# Patient Record
Sex: Male | Born: 1995 | Hispanic: Yes | Marital: Single | Smoking: Former smoker
Health system: Southern US, Community
[De-identification: ages and names within clinical notes are randomized; demographics above are authoritative.]

## PROBLEM LIST (undated history)

## (undated) DIAGNOSIS — K5000 Crohn's disease of small intestine without complications: Secondary | ICD-10-CM

## (undated) DIAGNOSIS — K5090 Crohn's disease, unspecified, without complications: Secondary | ICD-10-CM

## (undated) HISTORY — DX: Crohn's disease, unspecified, without complications: K50.90

## (undated) HISTORY — DX: Crohn's disease of small intestine without complications: K50.00

---

## 2018-08-24 ENCOUNTER — Ambulatory Visit (INDEPENDENT_AMBULATORY_CARE_PROVIDER_SITE_OTHER): Payer: Self-pay | Admitting: Internal Medicine

## 2018-08-30 ENCOUNTER — Ambulatory Visit (INDEPENDENT_AMBULATORY_CARE_PROVIDER_SITE_OTHER): Payer: PRIVATE HEALTH INSURANCE | Admitting: Internal Medicine

## 2018-08-30 ENCOUNTER — Encounter (INDEPENDENT_AMBULATORY_CARE_PROVIDER_SITE_OTHER): Payer: Self-pay | Admitting: Internal Medicine

## 2018-08-30 ENCOUNTER — Other Ambulatory Visit: Payer: Self-pay

## 2018-08-30 DIAGNOSIS — K5000 Crohn's disease of small intestine without complications: Secondary | ICD-10-CM | POA: Diagnosis not present

## 2018-08-30 DIAGNOSIS — R1010 Upper abdominal pain, unspecified: Secondary | ICD-10-CM | POA: Diagnosis not present

## 2018-08-30 DIAGNOSIS — K5090 Crohn's disease, unspecified, without complications: Secondary | ICD-10-CM

## 2018-08-30 HISTORY — DX: Crohn's disease, unspecified, without complications: K50.90

## 2018-08-30 NOTE — Patient Instructions (Signed)
Labs and CT abdomen/pelvis with CM.

## 2018-08-30 NOTE — Progress Notes (Signed)
   Subjective:    Patient ID: Michael Hammond, male    DOB: 11/16/1975, 23 y.o.   MRN: [id number]  HPI  Referred by Dr. Olena Leatherwood for Crohn's disease. States he was diagnosed with possible Crohn's disease last year by CT scan. He was having abdominal pain. Had been having abdominal pain for about a week or so. There had been no fever. He said he did have weight loss. His stools were either loose or formed. Underwent a CT which revealed: 08/06/2017 CT abdomen/pelvis with CM: wt loss. Mid to lower abdominal pain. Suspect mild inflammatory process involving the terminal ileum possible Crohn's disease. No other significant abdominal/pelvic findings.  He was started on Sulfasalazine 500mg  TID. Also covered last year with Prednisone. His appetite is okay. Maybe a one pound weight loss.  He is not having any abdominal pain. If he has epigastric pain, he has nausea.  Has epigastric pain about 1-3 times a week.  Has a BM daily. No melena or BRRB.  No family hx of Crohn's disease.  Works at [employer] in [place].    Review of Systems     Past Medical History:  Diagnosis Date  . Crohn disease (HCC) 08/30/2018      No Known Allergies  Current Outpatient Medications on File Prior to Visit  Medication Sig Dispense Refill  . sulfaSALAzine (AZULFIDINE) 500 MG tablet Take 500 mg by mouth 3 (three) times daily.     No current facility-administered medications on file prior to visit.      Objective:   Physical Exam Blood pressure 127/85, pulse 90, temperature 98 F (36.7 C), height 5\' 9"  (1.753 m), weight 110 lb 4.8 oz (50 kg).  Alert and oriented. Skin warm and dry. Oral mucosa is moist.   . Sclera anicteric, conjunctivae is pink. Thyroid not enlarged. No cervical lymphadenopathy. Lungs clear. Heart regular rate and rhythm.  Abdomen is soft. Bowel sounds are positive. No hepatomegaly. No abdominal masses felt. No tenderness.  No edema to lower extremities.         Assessment & Plan:  Crohn's  disease. Am going to get a CT abdomen/pelvis with CM. CRP and CBC. OV in 3 months.

## 2018-08-31 LAB — CBC WITH DIFFERENTIAL/PLATELET
Absolute Monocytes: 577 cells/uL (ref 200–950)
Basophils Absolute: 37 cells/uL (ref 0–200)
Basophils Relative: 0.5 %
Eosinophils Absolute: 110 cells/uL (ref 15–500)
Eosinophils Relative: 1.5 %
HCT: 42.4 % (ref 38.5–50.0)
Hemoglobin: 14.7 g/dL (ref 13.2–17.1)
Lymphs Abs: 1832 cells/uL (ref 850–3900)
MCH: 32.7 pg (ref 27.0–33.0)
MCHC: 34.7 g/dL (ref 32.0–36.0)
MCV: 94.2 fL (ref 80.0–100.0)
MPV: 11 fL (ref 7.5–12.5)
Monocytes Relative: 7.9 %
Neutro Abs: 4745 cells/uL (ref 1500–7800)
Neutrophils Relative %: 65 %
Platelets: 252 10*3/uL (ref 140–400)
RBC: 4.5 10*6/uL (ref 4.20–5.80)
RDW: 11.6 % (ref 11.0–15.0)
Total Lymphocyte: 25.1 %
WBC: 7.3 10*3/uL (ref 3.8–10.8)

## 2018-08-31 LAB — C-REACTIVE PROTEIN: CRP: 0.5 mg/L (ref <8.0)

## 2018-09-04 ENCOUNTER — Telehealth (INDEPENDENT_AMBULATORY_CARE_PROVIDER_SITE_OTHER): Payer: Self-pay | Admitting: Internal Medicine

## 2018-09-04 DIAGNOSIS — K5010 Crohn's disease of large intestine without complications: Secondary | ICD-10-CM

## 2018-09-04 NOTE — Telephone Encounter (Signed)
Am going to order an Inflammatory bowel disease panel

## 2018-09-14 LAB — INFLAMMATORY BOWEL DISEASE-IBD
Atypical pANCA: <1:20 {titer}
Saccharomyces cerevisiae, IgA: <20 Units (ref 0.0–24.9)
Saccharomyces cerevisiae, IgG: 23 Units (ref 0.0–24.9)

## 2018-09-24 ENCOUNTER — Ambulatory Visit (HOSPITAL_COMMUNITY)
Admission: RE | Admit: 2018-09-24 | Discharge: 2018-09-24 | Disposition: A | Discharge status: Home / Self Care | Payer: PRIVATE HEALTH INSURANCE | Source: Ambulatory Visit | Attending: Internal Medicine | Admitting: Internal Medicine

## 2018-09-24 ENCOUNTER — Other Ambulatory Visit: Payer: Self-pay

## 2018-09-24 DIAGNOSIS — R1010 Upper abdominal pain, unspecified: Secondary | ICD-10-CM | POA: Diagnosis present

## 2018-09-24 DIAGNOSIS — K5000 Crohn's disease of small intestine without complications: Secondary | ICD-10-CM | POA: Insufficient documentation

## 2018-09-24 MED ORDER — IOHEXOL 300 MG/ML  SOLN
75.0000 mL | Freq: Once | INTRAMUSCULAR | Status: AC | PRN
  Administered 2018-09-24: 13:00:00 75 mL via INTRAVENOUS

## 2018-09-25 ENCOUNTER — Telehealth (INDEPENDENT_AMBULATORY_CARE_PROVIDER_SITE_OTHER): Payer: Self-pay | Admitting: Internal Medicine

## 2018-09-25 NOTE — Telephone Encounter (Signed)
Michael Hammond, Needs OV in 3 months.

## 2018-12-26 ENCOUNTER — Ambulatory Visit (INDEPENDENT_AMBULATORY_CARE_PROVIDER_SITE_OTHER): Payer: PRIVATE HEALTH INSURANCE | Admitting: Internal Medicine

## 2018-12-26 ENCOUNTER — Encounter (INDEPENDENT_AMBULATORY_CARE_PROVIDER_SITE_OTHER): Payer: Self-pay | Admitting: Internal Medicine

## 2018-12-26 ENCOUNTER — Other Ambulatory Visit: Payer: Self-pay

## 2018-12-26 VITALS — BP 120/85 | HR 72 | Temp 98.2°F | Resp 18 | Ht 69.0 in | Wt 109.0 lb

## 2018-12-26 DIAGNOSIS — R634 Abnormal weight loss: Secondary | ICD-10-CM | POA: Diagnosis not present

## 2018-12-26 DIAGNOSIS — R1012 Left upper quadrant pain: Secondary | ICD-10-CM | POA: Diagnosis not present

## 2018-12-26 MED ORDER — OMEPRAZOLE 20 MG PO CPDR: 20.0000 mg | DELAYED_RELEASE_CAPSULE | Freq: Every day | ORAL | 1 refills | Status: AC

## 2018-12-26 NOTE — Progress Notes (Signed)
CBC, CRP 

## 2018-12-26 NOTE — Progress Notes (Addendum)
Subjective:    Patient ID: Michael Hammond, male    DOB: 03-03-1996, 23 y.o.   MRN: [id number]  HPI  Michael SportsmanBrandon Hammond is a 23 year old male who who presents today for follow up regarding suspected small bowel Crohn's disease. He reports having variable abdominal pain for the past 2 years. He was seen by his PCP March 2019 with complaints of generalized abdominal pain. He underwent an abd/pelvic CT 08/22/2017 which showed mild inflammation at the TI with mucosal wall thickening somewhat suspicious for small bowel Crohn's disease. His PCP placed him on Sulfasalzine 500mg  po tid. Possibly took Prednisone briefly.He stated his generalized abdominal pain was "numbed" after taking Sulfasalazine but never completely abated. He was having soft to loose brown stools 1 to 2 times daily without blood or mucous, no change after starting Sulfasalazine. His stress level was significantly elevated at that time due to family issues. He continued to have abdominal pain, mostly LUQ pain. He was seen in our office by Dorene Arerri  NP 08/30/2018. A repeat abdominal/pelvic CT was done 09/24/2018 which was normal, no inflammation at the TI. A CBC, CRP and IBD panel were negative. Sulfasalazine was discontinued.   He presents today with continued intermittent LUQ abdominal pain which occurs approximately 3 times weekly. He describes the pain as sharp, sometimes throbbing quality. No specific food triggers. He can eat a certain food and have LUQ pain on one day, if he eats the exact same food another day he does not experience any stomach pain. No nausea or vomiting. No classic heartburn. He sometimes feels food gets stuck briefly in his throat if he eats too fast, food passes after he drinks water. He takes Aleve 2 tabs infrequently, last took one day last week for neck pain. His alcohol intake varies, typically does not drink. However, every once in a while he will drink 3 to 4 tequila drinks on Fri and Saturday night. No drug use.  He eats frozen or prepared meals. Low fiber diet. He has difficulty gaining weight. He reports losing a few pounds, weighs 110 lbs. No fever, sweats or chills. History of anxiety and depression, previously took medications but he can't remember the names of these medications. He is less stressed now and he feels his anxiety and depression are well controlled. He does not see a therapist. No family history of stomach ulcer, upper or lower GI cancer.  Past medical history: abdominal pain  Current Outpatient Medications on File Prior to Visit  Medication Sig Dispense Refill  . sulfaSALAzine (AZULFIDINE) 500 MG tablet Take 500 mg by mouth 3 (three) times daily.     No current facility-administered medications on file prior to visit.    No Known Allergies  FH: no family hx of upper or lower GI malignancy SH: single male, drinks 3 to 4 drinks monthly or less, he vapes, no drug use  Review of Systems See HPI. All other systems reviewed and are negative     Objective:   Physical Exam BP 120/85   Pulse 72   Temp 98.2 F (36.8 C) (Oral)   Resp 18   Ht 5\' 9"  (1.753 m)   Wt 109 lb (49.4 kg)   BMI 16.10 kg/m   General: thin male in NAD Head: normocephalic Eyes: sclera non-icteric Mouth: dentition intact, no ulcers Neck: supple, no thyromegaly Heart: RRR, no murmur Lungs: clear throughout Abdomen: soft, nondistended, flat, nontender, + BS x 4 quads, no HSM. Extremities: no edema Neuro: alert and oriented,  no focal deficits     Assessment & Plan:   1. 23 y.o. male wit LUQ abdominal pain. I would NOT label him with the diagnosis of Crohn's disease  -check H.Pylori breath test, CBC, CRP, Celiac panel, CMP,  Lipase -eventual EGD if his LUQ pain persists -trail with Omeprazole 20mg  one tab po QD to start after H. Pylori breath test completed -gastritis handout given to patient, avoid alcohol, avoid NSAIDS -follow up in office in 6 weeks, patient to call office if his abdominal pain worsens  -stop folic acid as patient was prescribed this when he was on Sulfasalazine  2. Weight loss, unable to gain weight. CTAP x 2 does not explain weight loss -TSH

## 2018-12-26 NOTE — Patient Instructions (Addendum)
1. Complete the provided lab order, do not eat/drink or  vap for 1 hour prior to lab collection  2. Omeprazole 20mg  one capsule by mouth to be taken 30 minutes before breakfast. Do not start Omeprazole until you have completed the above lab order.  3. Avoid spicy acidic foods  4. Avoid alcohol   5. Call office if your abdominal pain worsens  6. Follow up in the office in 6 weeks Gastritis, Adult Gastritis is inflammation of the stomach. There are two kinds of gastritis:  Acute gastritis. This kind develops suddenly.  Chronic gastritis. This kind is much more common and lasts for a long time. Gastritis happens when the lining of the stomach becomes weak or gets damaged. Without treatment, gastritis can lead to stomach bleeding and ulcers. What are the causes? This condition may be caused by:  An infection.  Drinking too much alcohol.  Certain medicines. These include steroids, antibiotics, and some over-the-counter medicines, such as aspirin or ibuprofen.  Having too much acid in the stomach.  A disease of the intestines or stomach.  Stress.  An allergic reaction.  Crohn's disease.  Some cancer treatments (radiation). Sometimes the cause of this condition is not known. What are the signs or symptoms? Symptoms of this condition include:  Pain or a burning sensation in the upper abdomen.  Nausea.  Vomiting.  An uncomfortable feeling of fullness after eating.  Weight loss.  Bad breath.  Blood in your vomit or stools. In some cases, there are no symptoms. How is this diagnosed? This condition may be diagnosed with:  Your medical history and a description of your symptoms.  A physical exam.  Tests. These can include: ? Blood tests. ? Stool tests. ? A test in which a thin, flexible instrument with a light and a camera is passed down the esophagus and into the stomach (upper endoscopy). ? A test in which a sample of tissue is taken for testing (biopsy). How  is this treated? This condition may be treated with medicines. The medicines that are used vary depending on the cause of the gastritis:  If the condition is caused by a bacterial infection, you may be given antibiotic medicines.  If the condition is caused by too much acid in the stomach, you may be given medicines called H2 blockers, proton pump inhibitors, or antacids. Treatment may also involve stopping the use of certain medicines, such as aspirin, ibuprofen, or other NSAIDs. Follow these instructions at home: Medicines  Take over-the-counter and prescription medicines only as told by your health care provider.  If you were prescribed an antibiotic medicine, take it as told by your health care provider. Do not stop taking the antibiotic even if you start to feel better. Eating and drinking   Eat small, frequent meals instead of large meals.  Avoid foods and drinks that make your symptoms worse.  Drink enough fluid to keep your urine pale yellow. Alcohol use  Do not drink alcohol if: ? Your health care provider tells you not to drink. ? You are pregnant, may be pregnant, or are planning to become pregnant.  If you drink alcohol: ? Limit your use to:  0-1 drink a day for women.  0-2 drinks a day for men. ? Be aware of how much alcohol is in your drink. In the [place] one drink equals one 12 oz bottle of beer (355 mL), one 5 oz glass of wine (148 mL), or one 1 oz glass of hard liquor (44  mL). General instructions  Talk with your health care provider about ways to manage stress, such as getting regular exercise or practicing deep breathing, meditation, or yoga.  Do not use any products that contain nicotine or tobacco, such as cigarettes and e-cigarettes. If you need help quitting, ask your health care provider.  Keep all follow-up visits as told by your health care provider. This is important. Contact a health care provider if:  Your symptoms get worse.  Your symptoms  return after treatment. Get help right away if:  You vomit blood or material that looks like coffee grounds.  You have black or dark red stools.  You are unable to keep fluids down.  Your abdominal pain gets worse.  You have a fever.  You do not feel better after one week. Summary  Gastritis is inflammation of the lining of the stomach that can occur suddenly (acute) or develop slowly over time (chronic).  This condition is diagnosed with a medical history, a physical exam, or tests.  This condition may be treated with medicines to treat infection or medicines to reduce the amount of acid in your stomach.  Follow your health care provider's instructions about taking medicines, making changes to your diet, and knowing when to call for help. This information is not intended to replace advice given to you by your health care provider. Make sure you discuss any questions you have with your health care provider. Document Released: 05/10/2001 Document Revised: 10/03/2017 Document Reviewed: 10/03/2017 Elsevier Patient Education  2020 [employer].

## 2019-01-14 LAB — COMPREHENSIVE METABOLIC PANEL
AG Ratio: 2.2 (calc) (ref 1.0–2.5)
ALT: 16 U/L (ref 9–46)
AST: 17 U/L (ref 10–40)
Albumin: 4.8 g/dL (ref 3.6–5.1)
Alkaline phosphatase (APISO): 40 U/L (ref 36–130)
BUN: 9 mg/dL (ref 7–25)
CO2: 30 mmol/L (ref 20–32)
Calcium: 9.7 mg/dL (ref 8.6–10.3)
Chloride: 106 mmol/L (ref 98–110)
Creat: 0.82 mg/dL (ref 0.60–1.35)
Globulin: 2.2 g/dL (calc) (ref 1.9–3.7)
Glucose, Bld: 82 mg/dL (ref 65–99)
Potassium: 4.3 mmol/L (ref 3.5–5.3)
Sodium: 141 mmol/L (ref 135–146)
Total Bilirubin: 0.8 mg/dL (ref 0.2–1.2)
Total Protein: 7 g/dL (ref 6.1–8.1)

## 2019-01-14 LAB — CELIAC DISEASE PANEL
(tTG) Ab, IgA: 1 U/mL
(tTG) Ab, IgG: 4 U/mL
Gliadin IgA: 8 Units
Gliadin IgG: 2 Units
Immunoglobulin A: 134 mg/dL (ref 47–310)

## 2019-01-14 LAB — CBC
HCT: 41.7 % (ref 38.5–50.0)
Hemoglobin: 14.3 g/dL (ref 13.2–17.1)
MCH: 31.4 pg (ref 27.0–33.0)
MCHC: 34.3 g/dL (ref 32.0–36.0)
MCV: 91.6 fL (ref 80.0–100.0)
MPV: 10.8 fL (ref 7.5–12.5)
Platelets: 241 10*3/uL (ref 140–400)
RBC: 4.55 10*6/uL (ref 4.20–5.80)
RDW: 11.8 % (ref 11.0–15.0)
WBC: 4.9 10*3/uL (ref 3.8–10.8)

## 2019-01-14 LAB — LIPASE: Lipase: 35 U/L (ref 7–60)

## 2019-01-14 LAB — H. PYLORI BREATH TEST: H. pylori Breath Test: NOT DETECTED

## 2019-01-14 LAB — C-REACTIVE PROTEIN: CRP: <0.2 mg/L (ref <8.0)

## 2019-01-14 LAB — TSH: TSH: 0.65 mIU/L (ref 0.40–4.50)

## 2019-02-04 NOTE — Progress Notes (Addendum)
Subjective:    Patient ID: Michael Hammond, male    DOB: 03-Feb-1996, 23 y.o.   MRN: [id number]  It does not appear that patient has inflammatory bowel disease.  CT in March 2019 revealed wall thickening UTI but study in April 2020 was unremarkable. His present symptoms are not consistent with IBD. Agree with above recommendations.  Michael Hammond is a 23 year old male who I saw in the office on 12/26/2018 for follow up regarding LUQ abdominal pain. As previously reviewed, Michael Hammond was seen by his PCP March 2019 with complaints of generalized abdominal pain. He underwent an abd/pelvic CT 08/22/2017 which showed mild inflammation at the TI with mucosal wall thickening somewhat suspicious for small bowel Crohn's disease. His PCP placed him on Sulfasalzine 500mg  po tid. Possibly took Prednisone briefly. He stated his generalized abdominal pain was "numbed" after taking Sulfasalazine but never completely abated. He was having soft to loose brown stools 1 to 2 times daily without blood or mucous, no change after starting Sulfasalazine. His stress level was significantly elevated at that time due to family issues. He continued to have abdominal pain, mostly LUQ pain. He was seen in our office by Dorene Arerri Setzer NP 08/30/2018. A repeat abdominal/pelvic CT was done 09/24/2018 which was normal, no inflammation at the TI. A CBC, CRP and IBD panel were negative. Sulfasalazine was discontinued. I saw Michael Hammond in the office on 12/26/2018 for follow up with complaints of intermittent LUQ pain. Labs were ordered and I prescribed Omeprazole 20mg  once daily. 01/11/2019 H. Pylori breath test was negative. Celiac panel was normal. CBC was normal. TSH 0.65.  He presents today for follow up. He did not start taking Omeprazole because he didn't have any further LUQ pain. However, he awakened in the middle of the night with a headache 2 weeks ago. He took Aleve 2 tabs on an empty stomach and shortly after he vomited partially digested  food. He does not recall what he ate for dinner the evening before. No further vomiting since then. His BM pattern varies, he passes a soft or solid stool most days. Occasional loose stool. No rectal bleeding. No further weight loss, his weight remains at 109lbs. He often skips meals.  CBC    Component Value Date/Time   WBC 4.9 01/11/2019 1013   RBC 4.55 01/11/2019 1013   HGB 14.3 01/11/2019 1013   HCT 41.7 01/11/2019 1013   PLT 241 01/11/2019 1013   MCV 91.6 01/11/2019 1013   MCH 31.4 01/11/2019 1013   MCHC 34.3 01/11/2019 1013   RDW 11.8 01/11/2019 1013   LYMPHSABS 1,832 08/30/2018 1002   EOSABS 110 08/30/2018 1002   BASOSABS 37 08/30/2018 1002       Objective:   Physical Exam BP 123/78    Pulse 64    Temp 98.6 F (37 C) (Oral)    Wt 109 lb 4.8 oz (49.6 kg)    BMI 16.14 kg/m   General: 23 year old thin male in no acute distress Eyes: Sclera nonicteric, conjunctiva pink Heart: Regular rate and rhythm, no murmurs Lungs: Clear throughout Abdomen: Flat, soft, nontender, no masses or organomegaly Extremities: No edema Neuro: Alert and oriented x4, no focal deficits    Assessment & Plan:   1.  Left upper quadrant pain, improving.  No significant left upper quadrant abdominal pain since his last office visit.  H. pylori breath test and celiac panel were negative.  Weight is stable at 109 pounds but he still underweight. -Increase protein intake, recommended  eating 2 protein bars daily.  Do not skip meals. -I recommended scheduling an EGD if his left upper quadrant pain recurs or if he is unable to gain weight. -Follow-up in the office in 3 to 4 months

## 2019-02-06 ENCOUNTER — Ambulatory Visit (INDEPENDENT_AMBULATORY_CARE_PROVIDER_SITE_OTHER): Payer: PRIVATE HEALTH INSURANCE | Admitting: Nurse Practitioner

## 2019-02-06 ENCOUNTER — Other Ambulatory Visit: Payer: Self-pay

## 2019-02-06 ENCOUNTER — Encounter (INDEPENDENT_AMBULATORY_CARE_PROVIDER_SITE_OTHER): Payer: Self-pay | Admitting: Nurse Practitioner

## 2019-02-06 VITALS — BP 123/78 | HR 64 | Temp 98.6°F | Wt 109.3 lb

## 2019-02-06 DIAGNOSIS — R1012 Left upper quadrant pain: Secondary | ICD-10-CM

## 2019-02-06 NOTE — Patient Instructions (Signed)
1. Eat 3 meals daily, try eating 1 or 2 protein bars daily  2. Call our office if your left upper abdominal pain recurs   3. Follow up in office in 3 to 4 months and as needed

## 2019-06-10 ENCOUNTER — Ambulatory Visit (INDEPENDENT_AMBULATORY_CARE_PROVIDER_SITE_OTHER): Payer: PRIVATE HEALTH INSURANCE | Admitting: Nurse Practitioner

## 2019-06-11 ENCOUNTER — Ambulatory Visit (INDEPENDENT_AMBULATORY_CARE_PROVIDER_SITE_OTHER): Payer: PRIVATE HEALTH INSURANCE | Admitting: Internal Medicine

## 2020-10-26 IMAGING — CT CT ABDOMEN AND PELVIS WITH CONTRAST
2 of 4 series · 14 of 46 positions shown, 16 images · IV contrast (Isovue)
Comparison: 08/22/2017

CLINICAL DATA: Abdominal pain, Crohn's disease

EXAM:
CT ABDOMEN AND PELVIS WITH CONTRAST
TECHNIQUE: Multidetector CT imaging of the abdomen and pelvis was performed
using the standard protocol following bolus administration of
intravenous contrast.
CONTRAST:  75mL OMNIPAQUE IOHEXOL 300 MG/ML SOLN, additional oral
enteric contrast

[Series 2: axial st · axial · 0.59mm/px · z∈[-768,-348]mm · 11 of 94 slices shown, 13 images]
[im 5/94  soft-tissue]
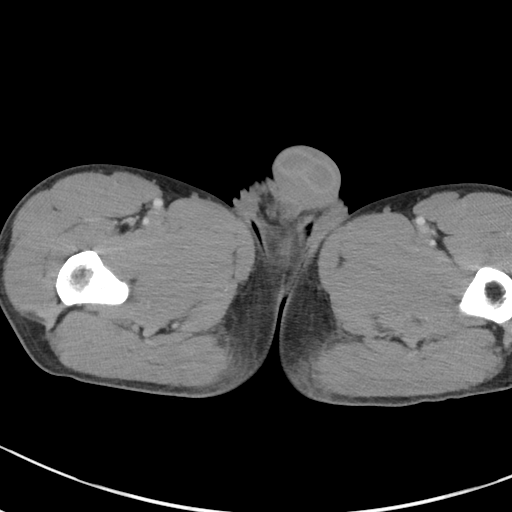
[im 5/94  bone]
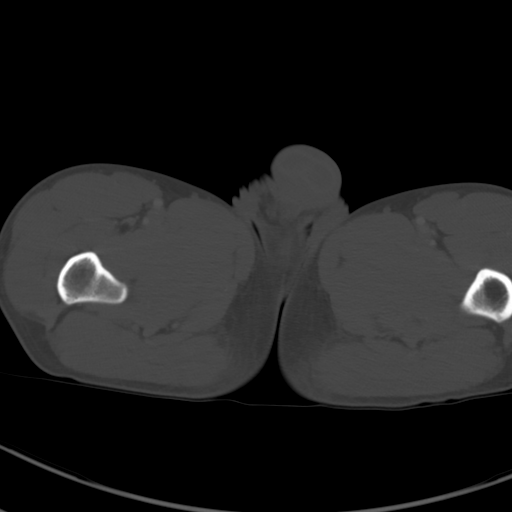
[im 15/94  soft-tissue]
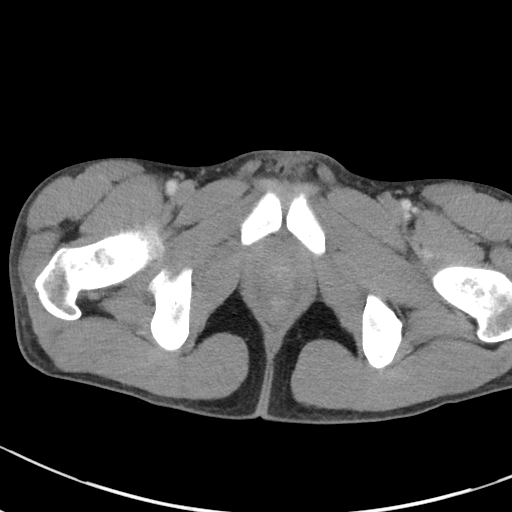
[im 25/94  soft-tissue]
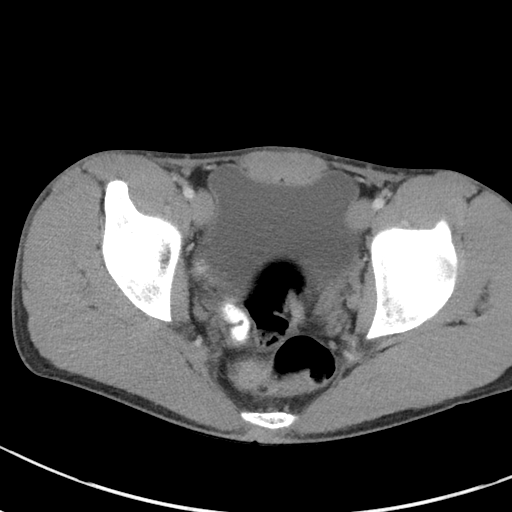
[im 30/94  soft-tissue]
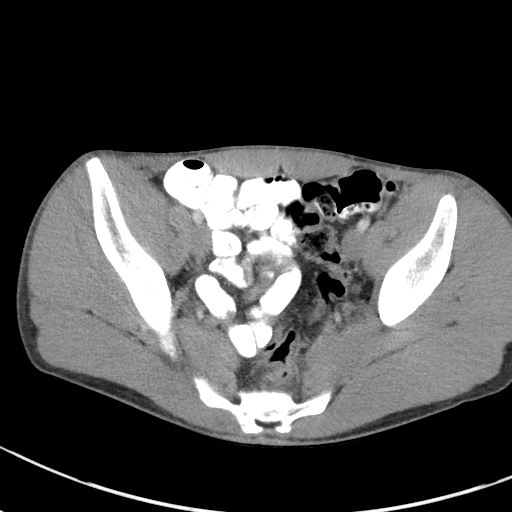
[im 40/94  soft-tissue]
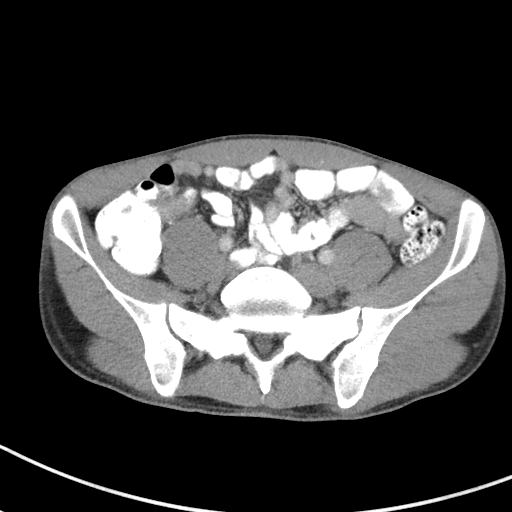
[im 49/94  soft-tissue]
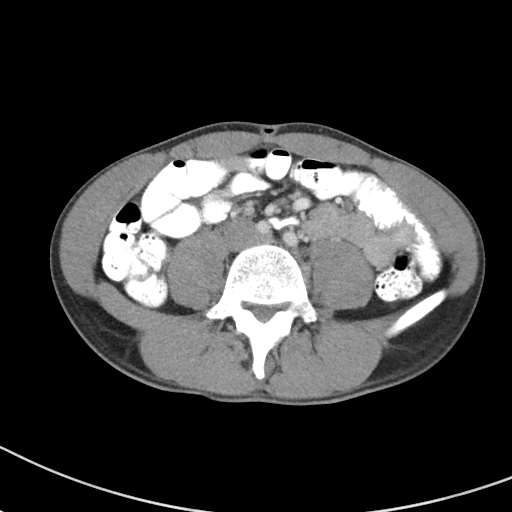
[im 54/94  soft-tissue]
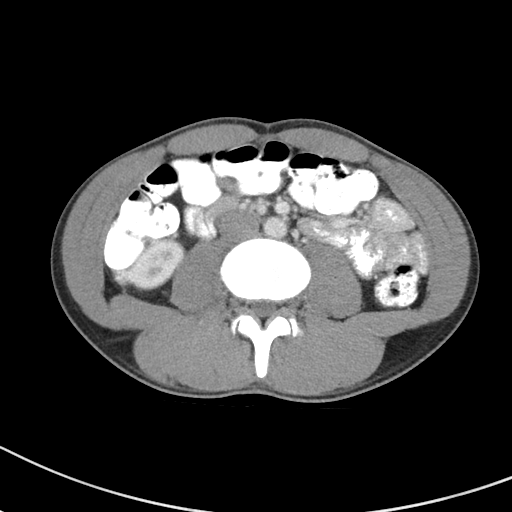
[im 64/94  soft-tissue]
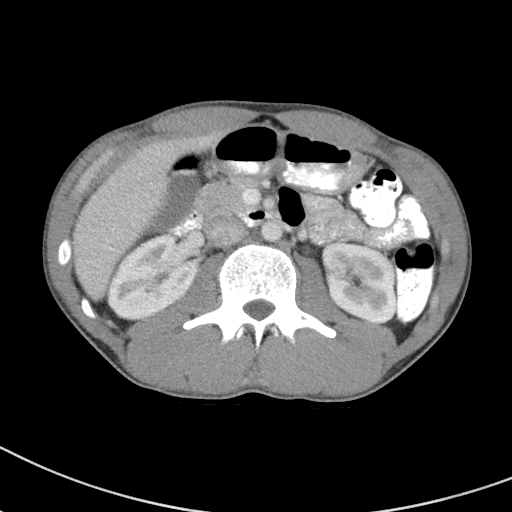
[im 69/94  soft-tissue]
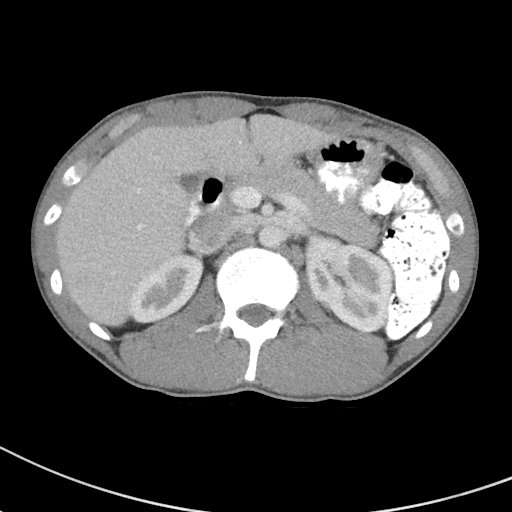
[im 69/94  bone]
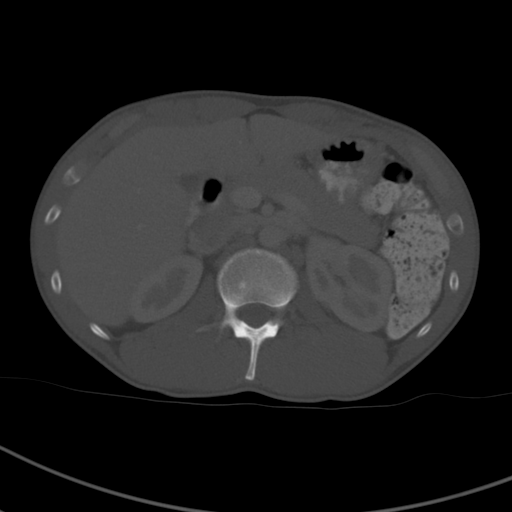
[im 79/94  soft-tissue]
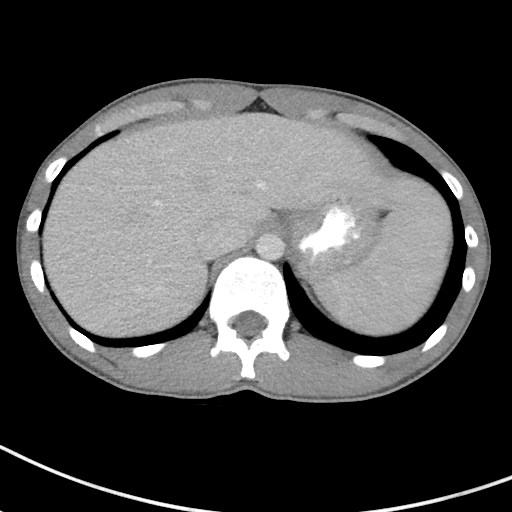
[im 89/94  soft-tissue]
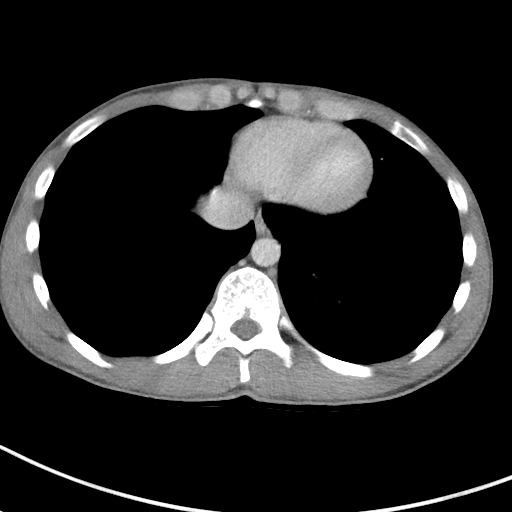

[Series 5: coronal st · coronal · 0.50mm/px · 3 of 66 slices shown]
[im 22/66  soft-tissue]
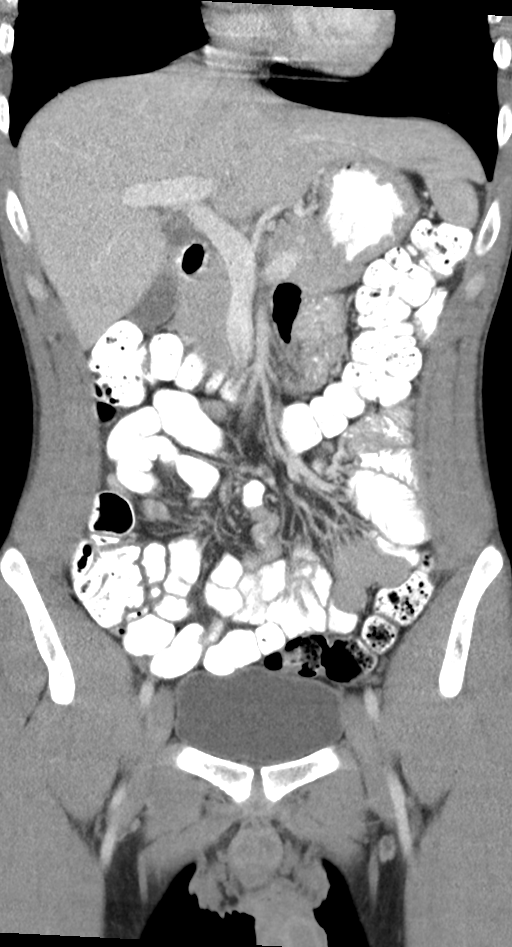
[im 29/66  soft-tissue]
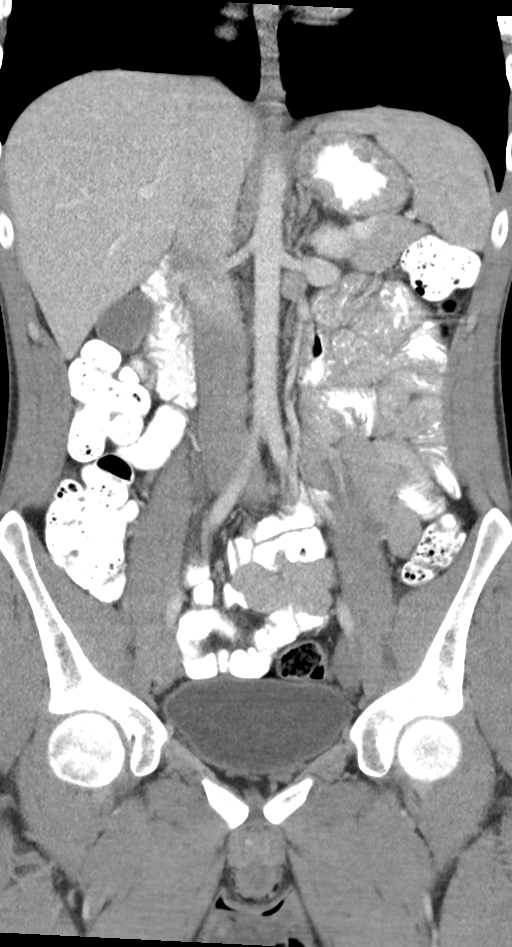
[im 37/66  soft-tissue]
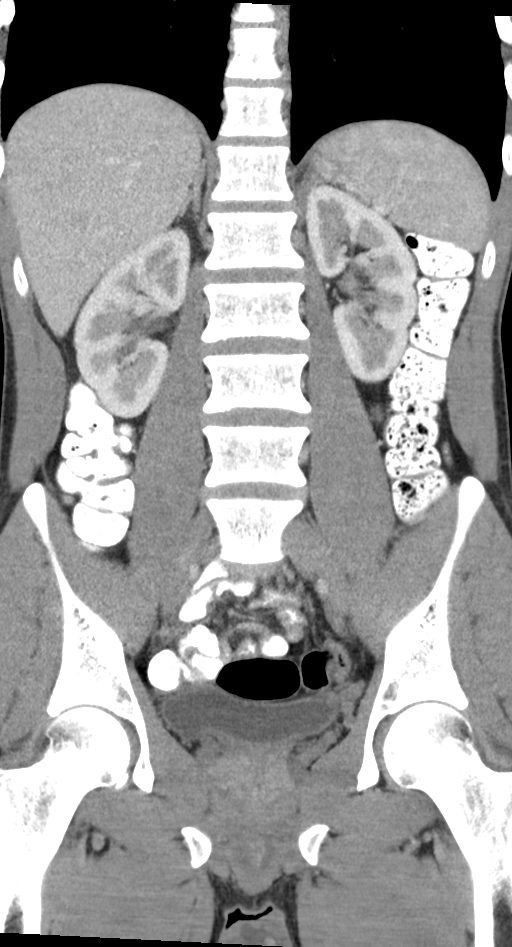

[14 of 46 positions shown; findings below may reference images not displayed]

FINDINGS: Lower chest: No acute abnormality.

Hepatobiliary: No focal liver abnormality is seen. No gallstones,
gallbladder wall thickening, or biliary dilatation.

Pancreas: Unremarkable. No pancreatic ductal dilatation or
surrounding inflammatory changes.

Spleen: Normal in size without focal abnormality.

Adrenals/Urinary Tract: Adrenal glands are unremarkable. Kidneys are
normal, without renal calculi, focal lesion, or hydronephrosis.
Bladder is unremarkable.

Stomach/Bowel: Stomach is within normal limits. Appendix appears
normal. No evidence of bowel wall thickening, distention, or
inflammatory changes.

Vascular/Lymphatic: No significant vascular findings are present. No
enlarged abdominal or pelvic lymph nodes.

Reproductive: No mass or other abnormality.

Other: No abdominal wall hernia or abnormality. No abdominopelvic
ascites.

Musculoskeletal: No acute or significant osseous findings.
IMPRESSION: No CT findings to explain abdominal pain. There are no inflammatory
findings to suggest active Crohn's disease. Further evaluation of
the bowel for evidence of inflammation, stricture, or fistula
complicating Crohn's disease may be performed by dedicated CT or MR
enterography protocol if desired.

## 2020-12-01 ENCOUNTER — Ambulatory Visit (HOSPITAL_COMMUNITY): Payer: No Typology Code available for payment source | Admitting: Clinical

## 2020-12-01 ENCOUNTER — Telehealth (HOSPITAL_COMMUNITY): Payer: Self-pay | Admitting: Clinical

## 2020-12-01 ENCOUNTER — Other Ambulatory Visit: Payer: Self-pay

## 2020-12-01 NOTE — Telephone Encounter (Signed)
The pt was a no show, however, did call back in and rescheduled for this Thursday.

## 2020-12-03 ENCOUNTER — Ambulatory Visit (INDEPENDENT_AMBULATORY_CARE_PROVIDER_SITE_OTHER): Payer: No Typology Code available for payment source | Admitting: Clinical

## 2020-12-03 ENCOUNTER — Other Ambulatory Visit: Payer: Self-pay

## 2020-12-03 DIAGNOSIS — F331 Major depressive disorder, recurrent, moderate: Secondary | ICD-10-CM | POA: Diagnosis not present

## 2020-12-03 DIAGNOSIS — F419 Anxiety disorder, unspecified: Secondary | ICD-10-CM
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
## 2020-12-17 ENCOUNTER — Ambulatory Visit (INDEPENDENT_AMBULATORY_CARE_PROVIDER_SITE_OTHER): Payer: No Typology Code available for payment source | Admitting: Clinical

## 2020-12-17 ENCOUNTER — Other Ambulatory Visit: Payer: Self-pay

## 2020-12-17 DIAGNOSIS — F331 Major depressive disorder, recurrent, moderate: Secondary | ICD-10-CM

## 2020-12-17 DIAGNOSIS — F419 Anxiety disorder, unspecified: Secondary | ICD-10-CM | POA: Diagnosis not present

## 2020-12-17 NOTE — Progress Notes (Addendum)
Virtual Visit via Video Note   I connected with Michael Hammond on 12/17/20 at 3:00 PM EDT by a video enabled telemedicine application and verified that I am speaking with the correct person using two identifiers.   Location: Patient: Home Provider: Office   I discussed the limitations of evaluation and management by telemedicine and the availability of in person appointments. The patient expressed understanding and agreed to proceed.     THERAPIST PROGRESS NOTE   Session Time: 3:00PM-3:30PM   Participation Level: Active   Behavioral Response: CasualAlertDepression/Anxiety   Type of Therapy: Individual Therapy   Treatment Goals addressed: Coping   Interventions: CBT, Solution Focused, Strength-based, Supportive and Social Skills Training   Summary: Michael Hammond is a 25 y.o. male who presents with Bi-Polar and ADHD. The OPT therapist worked with the patient for his ongoing treatment. The OPT therapist utilized Motivational Interviewing to assist in creating therapeutic repore. The patient in the session was engaged and work in collaboration giving feedback about his triggers and symptoms over the past few weeks . The patient spoke about his stress at work and in his relationship as being triggers. The OPT therapist utilized Cognitive Behavioral Therapy through cognitive restructuring as well as worked with the patient on coping strategies to assist in management of mood, improve coping, and improve positive thinking while empowering the patient.The OPT therapist worked with the patient on balancing his work/leisure. The OPT therapist examined upcoming health appointments and continued to encourage the patient to stay consistent and compliant with all medical appointments and recommendations.    Suicidal/Homicidal: Nowithout intent/plan   Therapist Response: The OPT therapist worked with the patient for the patients  scheduled session. The patient was engaged in his session and gave  feedback in relation to triggers, symptoms, and behavior responses over the past few weeks. The OPT therapist worked with the patient utilizing an in session Cognitive Behavioral Therapy exercise. The patient was responsive in the session and verbalized, " I have difficulty from my work stress with switching off from work when I am done back to my free time and not allowing my work stress to spill over". The OPT therapist talked with the patient reviewing coping skills to assist with managing mood and giving encouragement to continue to implement to assist with leveling his mood. The patient spoke about the impact of doing more than his job at work and this spilling over into his private life. The OPT therapist worked with the patient validating his feelings and encouraging the patient to improve his work / life balance  through implementing boundaries.The OPT therapist will continue treatment work with the patient in his next scheduled session.     Plan: Return again in 3 weeks.   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 The patient was provided an opportunity to ask questions and all were answered. The patient agreed with the plan and demonstrated an understanding of the instructions.   The patient was advised to call back or seek an in-person evaluation if the symptoms worsen or if the condition fails to improve as anticipated.   I provided 30 minutes of non-face-to-face time during this encounter.   Winfred Burn, LCSW   12/17/2020

## 2021-01-12 ENCOUNTER — Other Ambulatory Visit: Payer: Self-pay

## 2021-01-12 ENCOUNTER — Ambulatory Visit (INDEPENDENT_AMBULATORY_CARE_PROVIDER_SITE_OTHER): Payer: No Typology Code available for payment source | Admitting: Clinical

## 2021-01-12 DIAGNOSIS — F331 Major depressive disorder, recurrent, moderate: Secondary | ICD-10-CM | POA: Diagnosis not present

## 2021-01-12 DIAGNOSIS — F419 Anxiety disorder, unspecified: Secondary | ICD-10-CM

## 2021-01-12 NOTE — Progress Notes (Signed)
Virtual Visit via Video Note   I connected with Michael Hammond on 01/12/21 at 2:00 PM EDT by a video enabled telemedicine application and verified that I am speaking with the correct person using two identifiers.   Location: Patient: Home Provider: Office   I discussed the limitations of evaluation and management by telemedicine and the availability of in person appointments. The patient expressed understanding and agreed to proceed.     THERAPIST PROGRESS NOTE   Session Time: 2:00PM-2:30PM   Participation Level: Active   Behavioral Response: CasualAlertDepression/Anxiety   Type of Therapy: Individual Therapy   Treatment Goals addressed: Coping   Interventions: CBT, Solution Focused, Strength-based, Supportive and Social Skills Training   Summary: Michael Hammond is a 25 y.o. male who presents with Bi-Polar and ADHD. The OPT therapist worked with the patient for his ongoing treatment. The OPT therapist utilized Motivational Interviewing to assist in creating therapeutic repore. The patient in the session was engaged and work in collaboration giving feedback about his triggers and symptoms over the past few weeks . The patient spoke about currently being on vacation in [facility] [place] , [place], [place]. The OPT therapist utilized Cognitive Behavioral Therapy through cognitive restructuring as well as worked with the patient on coping strategies to assist in management of mood, improve coping, and improve positive thinking while empowering the patient.The OPT therapist worked with the patient on balancing his work/leisure.  Th patient did identify concerns within his relationship but elected to speak on these in his next session.The OPT therapist examined upcoming health appointments and continued to encourage the patient to stay consistent and compliant with all medical appointments and recommendations.    Suicidal/Homicidal: Nowithout intent/plan   Therapist Response: The OPT  therapist worked with the patient for the patients  scheduled session. The patient was engaged in his session and gave feedback in relation to triggers, symptoms, and behavior responses over the past few weeks. The OPT therapist worked with the patient utilizing an in session Cognitive Behavioral Therapy exercise. The patient was responsive in the session and verbalized, " I am on vacation now and just working to focus on enjoying my time off and not thinking about work while I am on my break". The OPT therapist talked with the patient reviewing coping skills to assist with managing mood and giving encouragement to continue to implement to assist with leveling his mood. The OPT therapist worked with the patient validating his feelings and encouraging the patient to improve his work / life balance  through implementing boundaries. The patient notes post vacation expecting to address concerns with his girlfriend and not being sure the direction or future of the relationship.The OPT therapist will continue treatment work with the patient in his next scheduled session.     Plan: Return again in 3 weeks.   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 The patient was provided an opportunity to ask questions and all were answered. The patient agreed with the plan and demonstrated an understanding of the instructions.   The patient was advised to call back or seek an in-person evaluation if the symptoms worsen or if the condition fails to improve as anticipated.   I provided 30 minutes of  non-face-to-face time during this encounter.   Winfred Burn, LCSW   01/12/2021

## 2021-02-04 ENCOUNTER — Ambulatory Visit (INDEPENDENT_AMBULATORY_CARE_PROVIDER_SITE_OTHER): Payer: No Typology Code available for payment source | Admitting: Clinical

## 2021-02-04 ENCOUNTER — Other Ambulatory Visit: Payer: Self-pay

## 2021-02-04 DIAGNOSIS — F419 Anxiety disorder, unspecified: Secondary | ICD-10-CM

## 2021-02-04 DIAGNOSIS — F331 Major depressive disorder, recurrent, moderate: Secondary | ICD-10-CM

## 2021-02-04 NOTE — Progress Notes (Signed)
Virtual Visit via Telephone Note  I connected with Michael Hammond on 02/04/21 at  2:00 PM EDT by telephone and verified that I am speaking with the correct person using two identifiers.  Location: Patient: Home Provider: Office   I discussed the limitations, risks, security and privacy concerns of performing an evaluation and management service by telephone and the availability of in person appointments. I also discussed with the patient that there may be a patient responsible charge related to this service. The patient expressed understanding and agreed to proceed.  THERAPIST PROGRESS NOTE   Session Time: 2:00PM-2:45PM   Participation Level: Active   Behavioral Response: CasualAlertDepression/Anxiety   Type of Therapy: Individual Therapy   Treatment Goals addressed: Coping   Interventions: CBT, Solution Focused, Strength-based, Supportive and Social Skills Training   Summary: Michael Hammond is a 25 y.o. male who presents with Bi-Polar and ADHD. The OPT therapist worked with the patient for his ongoing treatment. The OPT therapist utilized Motivational Interviewing to assist in creating therapeutic repore. The patient in the session was engaged and work in collaboration giving feedback about his triggers and symptoms over the past few weeks . The patient spoke about stress around finances and getting a speeding ticket and stress from transitioning from vacation going right back into work. The OPT therapist utilized Cognitive Behavioral Therapy through cognitive restructuring as well as worked with the patient on coping strategies to assist in management of mood, improve coping, and improve positive thinking while empowering the patient.The OPT therapist worked with the patient on balancing his work/leisure.  The patient identified ongoing concerns within his relationship that he reviewed in the session The OPT therapist worked with the patient in reviewing a healthy relationship.The OPT  therapist examined upcoming health appointments and continued to encourage the patient to stay consistent and compliant with all medical appointments and recommendations.    Suicidal/Homicidal: Nowithout intent/plan   Therapist Response: The OPT therapist worked with the patient for the patients  scheduled session. The patient was engaged in his session and gave feedback in relation to triggers, symptoms, and behavior responses over the past few weeks. The OPT therapist worked with the patient utilizing an in session Cognitive Behavioral Therapy exercise. The patient was responsive in the session and verbalized, " I think stress over finances and my relationship are the main things ". The OPT therapist talked with the patient reviewing coping skills to assist with managing mood and giving encouragement to continue to implement to assist with leveling his mood. The OPT therapist worked with the patient validating his feelings and encouraging the patient to improve his work / life balance  through implementing boundaries. The patient notes not being sure the direction or future of the relationship.The OPT therapist will continue treatment work with the patient in his next scheduled session.     Plan: Return again in 3 weeks.   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 The patient was provided an opportunity to ask questions and all were answered. The patient agreed with the plan and demonstrated an understanding of the instructions.   The patient was advised to call back or seek an in-person evaluation if the symptoms worsen or if the condition fails  to improve as anticipated.   I provided 45 minutes of non-face-to-face time during this encounter.   Winfred Burn, LCSW   02/04/2022

## 2021-02-25 ENCOUNTER — Ambulatory Visit (INDEPENDENT_AMBULATORY_CARE_PROVIDER_SITE_OTHER): Payer: No Typology Code available for payment source | Admitting: Clinical

## 2021-02-25 ENCOUNTER — Other Ambulatory Visit: Payer: Self-pay

## 2021-02-25 DIAGNOSIS — F331 Major depressive disorder, recurrent, moderate: Secondary | ICD-10-CM

## 2021-02-25 DIAGNOSIS — F419 Anxiety disorder, unspecified: Secondary | ICD-10-CM | POA: Diagnosis not present

## 2021-02-25 NOTE — Progress Notes (Signed)
Virtual Visit via Telephone Note   I connected with Michael Hammond on 02/25/21 at  3:00 PM EDT by telephone and verified that I am speaking with the correct person using two identifiers.   Location: Patient: Home Provider: Office   I discussed the limitations, risks, security and privacy concerns of performing an evaluation and management service by telephone and the availability of in person appointments. I also discussed with the patient that there may be a patient responsible charge related to this service. The patient expressed understanding and agreed to proceed.   THERAPIST PROGRESS NOTE   Session Time: 3:00PM-3:45PM   Participation Level: Active   Behavioral Response: CasualAlertDepression/Anxiety   Type of Therapy: Individual Therapy   Treatment Goals addressed: Coping   Interventions: CBT, Solution Focused, Strength-based, Supportive and Social Skills Training   Summary: Michael Hammond is a 25 y.o. male who presents with Bi-Polar and ADHD. The OPT therapist worked with the patient for his ongoing treatment. The OPT therapist utilized Motivational Interviewing to assist in creating therapeutic repore. The patient in the session was engaged and work in collaboration giving feedback about his triggers and symptoms over the past few weeks . The patient spoke about stress around his relationship with his girlfriend. The OPT therapist utilized Cognitive Behavioral Therapy through cognitive restructuring as well as worked with the patient on coping strategies to assist in management of mood, improve coping, and improve positive thinking while empowering the patient.The OPT therapist worked with the patient on balancing his work/leisure.  The patient identified changes within his relationship from him sitting down with his girlfriend an having a conversation with her about his expectations moving forward with the relationship. The OPT therapist worked with the patient in reviewing a  healthy relationship.The OPT therapist examined upcoming health appointments and continued to encourage the patient to stay consistent and compliant with all medical appointments and recommendations.    Suicidal/Homicidal: Nowithout intent/plan   Therapist Response: The OPT therapist worked with the patient for the patients  scheduled session. The patient was engaged in his session and gave feedback in relation to triggers, symptoms, and behavior responses over the past few weeks. The OPT therapist worked with the patient utilizing an in session Cognitive Behavioral Therapy exercise. The patient was responsive in the session and verbalized, " I had a honest conversation and she agreed to the boundaries  ". The OPT therapist talked with the patient reviewing coping skills to assist with managing mood and giving encouragement to continue to implement to assist with leveling his mood. The OPT therapist worked with the patient validating his feelings and encouraging the patient to continue to be mindful of work/life balance.The OPT therapist will continue treatment work with the patient in his next scheduled session.     Plan: Return again in 3 weeks.   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 The patient was provided an opportunity to ask questions and all were answered. The patient agreed with the plan and demonstrated an understanding of the instructions.   The patient was advised to call back or seek an in-person evaluation if the symptoms worsen or if the condition fails to improve as anticipated.   I provided 45 minutes of  non-face-to-face time during this encounter.   Winfred Burn, LCSW   02/25/2021

## 2021-03-18 ENCOUNTER — Ambulatory Visit (INDEPENDENT_AMBULATORY_CARE_PROVIDER_SITE_OTHER): Payer: No Typology Code available for payment source | Admitting: Clinical

## 2021-03-18 ENCOUNTER — Other Ambulatory Visit: Payer: Self-pay

## 2021-03-18 DIAGNOSIS — F419 Anxiety disorder, unspecified: Secondary | ICD-10-CM | POA: Diagnosis not present

## 2021-03-18 DIAGNOSIS — F331 Major depressive disorder, recurrent, moderate: Secondary | ICD-10-CM | POA: Diagnosis not present

## 2021-03-18 NOTE — Progress Notes (Signed)
Virtual Visit via Telephone Note   I connected with Michael Hammond on 03/18/2021 at  2:00 PM EDT by telephone and verified that I am speaking with the correct person using two identifiers.   Location: Patient: Home Provider: Office   I discussed the limitations, risks, security and privacy concerns of performing an evaluation and management service by telephone and the availability of in person appointments. I also discussed with the patient that there may be a patient responsible charge related to this service. The patient expressed understanding and agreed to proceed.   THERAPIST PROGRESS NOTE   Session Time: 2:00PM-2:30PM   Participation Level: Active   Behavioral Response: CasualAlertDepression/Anxiety   Type of Therapy: Individual Therapy   Treatment Goals addressed: Coping   Interventions: CBT, Solution Focused, Strength-based, Supportive and Social Skills Training   Summary: Michael Hammond is a 25 y.o. male who presents with Bi-Polar and ADHD. The OPT therapist worked with the patient for his ongoing treatment. The OPT therapist utilized Motivational Interviewing to assist in creating therapeutic repore. The patient in the session was engaged and work in collaboration giving feedback about his triggers and symptoms over the past few weeks . The patient spoke about his relationship with his girlfriend and work the two have been doing to improve their communication. The patient spoke about a work related change that had a impact with the patient expecting more on his recent raise than he received. The OPT therapist utilized Cognitive Behavioral Therapy through cognitive restructuring as well as worked with the patient on coping strategies to assist in management of mood, improve coping, and improve positive thinking while empowering the patient.The OPT therapist worked with the patient on balancing his work/leisure. The OPT therapist worked with the patient in reviewing a healthy  relationship. The patient in this session identified his difficulty around general decision making as a trigger and worked with the OPT therapist on utilizing a decision making tool to assist.The OPT therapist examined upcoming health appointments and continued to encourage the patient to stay consistent and compliant with all medical appointments and recommendations.    Suicidal/Homicidal: Nowithout intent/plan   Therapist Response: The OPT therapist worked with the patient for the patients  scheduled session. The patient was engaged in his session and gave feedback in relation to triggers, symptoms, and behavior responses over the past few weeks. The OPT therapist worked with the patient utilizing an in session Cognitive Behavioral Therapy exercise. The patient was responsive in the session and verbalized, " I got screwed with my work I was expecting more from my raise, but I think I handled it well and it didn't effect me as much as I thought It would I wasn't as angry as I expected I was going to be". The OPT therapist talked with the patient reviewing coping skills to assist with managing mood and giving encouragement to continue to implement to assist with leveling his mood. The OPT therapist worked with the patient validating his feelings and encouraging the patient to continue to be mindful of work/life balance.The OPT therapist will continue treatment work with the patient in his next scheduled session.     Plan: Return again in 3 weeks.   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 The patient was provided an opportunity to ask questions and all were answered. The patient agreed with the plan and demonstrated an understanding of the instructions.   The patient was advised to call back or seek an in-person evaluation if the symptoms worsen or if the  condition fails to improve as anticipated.   I provided 30 minutes of non-face-to-face time during this encounter.   Winfred Burn, LCSW   03/18/2021

## 2021-04-08 ENCOUNTER — Telehealth (HOSPITAL_COMMUNITY): Payer: Self-pay | Admitting: Clinical

## 2021-04-08 ENCOUNTER — Ambulatory Visit (HOSPITAL_COMMUNITY): Payer: No Typology Code available for payment source | Admitting: Clinical

## 2021-04-08 ENCOUNTER — Other Ambulatory Visit: Payer: Self-pay

## 2021-04-08 NOTE — Telephone Encounter (Signed)
The patient did not respond to contact attempts and does not have a voicemail box set up
